# Patient Record
Sex: Male | Born: 1955 | Race: White | Hispanic: No | Marital: Married
Health system: Southern US, Community
[De-identification: ages and names within clinical notes are randomized; demographics above are authoritative.]

---

## 2006-11-02 ENCOUNTER — Ambulatory Visit: Payer: Self-pay | Admitting: Internal Medicine

## 2008-02-10 ENCOUNTER — Ambulatory Visit: Payer: Self-pay | Admitting: Specialist

## 2008-02-21 ENCOUNTER — Ambulatory Visit: Payer: Self-pay | Admitting: Orthopedic Surgery

## 2012-02-15 ENCOUNTER — Ambulatory Visit: Payer: Self-pay | Admitting: General Practice

## 2012-02-17 LAB — PATHOLOGY REPORT

## 2015-01-25 ENCOUNTER — Ambulatory Visit: Payer: Self-pay | Admitting: Internal Medicine

## 2015-02-07 ENCOUNTER — Ambulatory Visit: Payer: Self-pay | Admitting: Internal Medicine

## 2015-04-07 NOTE — Op Note (Signed)
PATIENT NAME:  Chris Garcia, Chris E MR#:  [id number] DATE OF BIRTH:  1956-02-10  DATE OF PROCEDURE:  02/15/2012  PREOPERATIVE DIAGNOSIS: Dupuytren's contracture to the right long finger.   POSTOPERATIVE DIAGNOSIS: Dupuytren's contracture to the right long finger.   PROCEDURES PERFORMED: Palmar fasciectomy and excision of Dupuytren's cord from the right long finger.   SURGEON: Illene LabradorJames P. Angie FavaHooten Jr., MD  ANESTHESIA: General.   ESTIMATED BLOOD LOSS: Minimal.   TOURNIQUET TIME: 68 minutes.   DRAINS: None.   INDICATIONS FOR SURGERY: The patient is a 59 year old right-hand dominant male who has been seen for complaints of Dupuytren's cords and contracture to the right long finger. He previously underwent surgery for Dupuytren's contracture to the left hand. The contracture was getting worse and he was having more difficulty with his activities of daily living. After discussion of the risks and benefits of surgical intervention, the patient expressed his understanding of the risks and benefits and agreed with plans for surgical intervention.   PROCEDURE IN DETAIL: Patient was brought into the Operating Room and, after adequate general anesthesia was achieved, a tourniquet was placed on the patient's upper right arm. Patient's right hand and arm were cleaned and prepped with alcohol and DuraPrep, draped in the usual sterile fashion. A "timeout" was performed as per usual protocol. The right upper extremity was exsanguinated using Esmarch, and tourniquet was inflated to 250 mmHg. Loupe magnification was used throughout the procedure. An incision was made in line with the pretendinous band and central cord to the mid palmar region and carried to the ulnar aspect of the base of the long finger. The cord was carefully resected with incision of the site proximally and then carefully dissected distally. Careful dissection was carried out along the area of the spiral cord. The incision was carried out in a GraysonBruner  type fashion to the level of the PIP joint crease. Dissection was carried out with care taken to protect the neurovascular bundle. The central cord as well as a portion of the spiral cord were resected and submitted to pathology. This allowed for full extension of the digit. The wound was irrigated with copious amounts of normal saline with antibiotic solution. Tourniquet was deflated after a total tourniquet time of 68 minutes. Good hemostasis was noted. Skin edges were reapproximated using a horizontal mattress suture of 5-0 nylon. Good approximation was noted. Good capillary refill was appreciated to the digit. 10 mL of 0.25% Marcaine was injected along the incision site. A sterile dressing was applied followed by application of a volar splint with the digits placed in position of function.   Patient tolerated procedure well. He was transported to the recovery room in stable condition.   ____________________________ Illene LabradorJames P. Angie FavaHooten Jr., MD jph:cms D: 02/15/2012 18:17:02 ET T: 02/16/2012 09:31:45 ET JOB#: [id number]  cc: Fayrene FearingJames P. Angie FavaHooten Jr., MD, <Dictator> JAMES P Angie FavaHOOTEN JR MD ELECTRONICALLY SIGNED 02/21/2012 20:38

## 2016-04-01 IMAGING — CR DG CHEST 2V
1 series · 2 of 2 positions shown · non-contrast
Comparison: None.

CLINICAL DATA: Melanoma on face

EXAM:
CHEST  2 VIEW

[Series 1: w chest pa · 0.14mm/px · 2 of 2 slices shown]
[im 1/2]
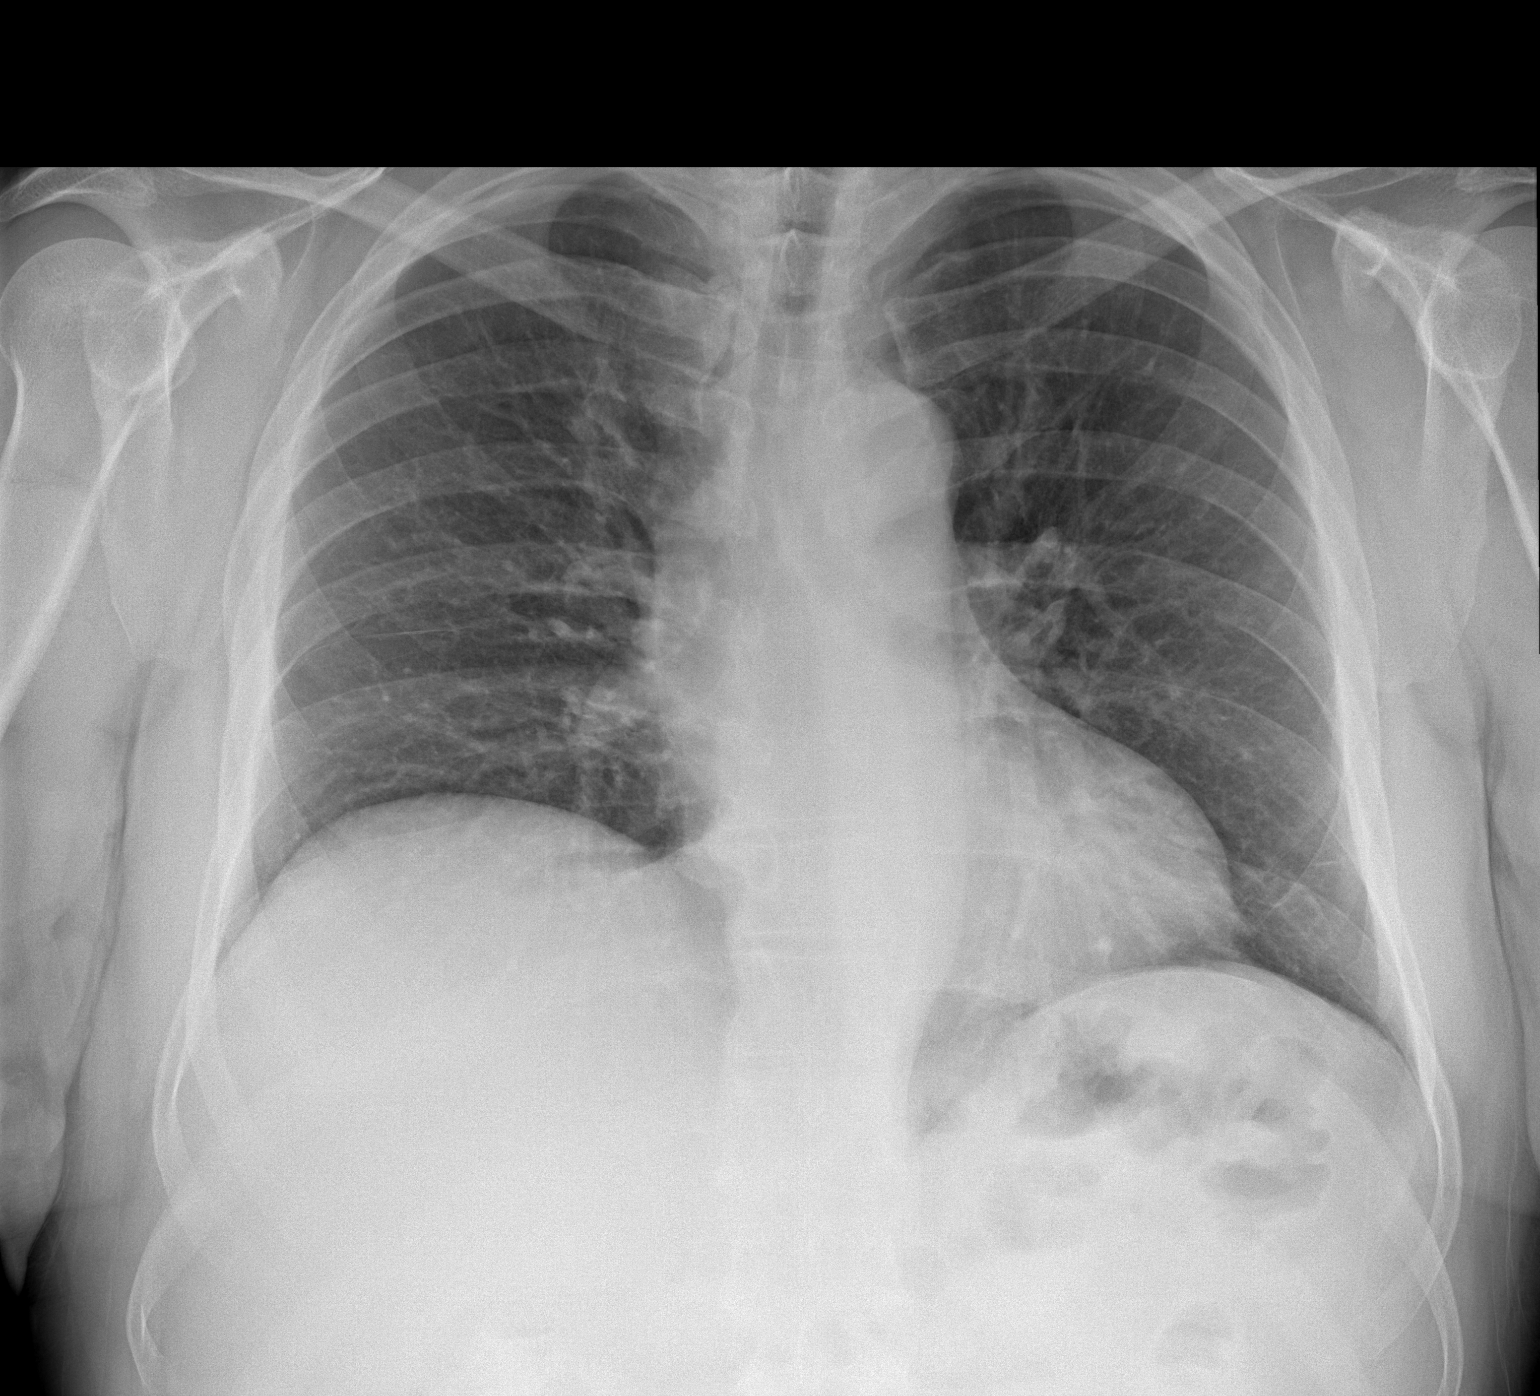
[im 2/2]
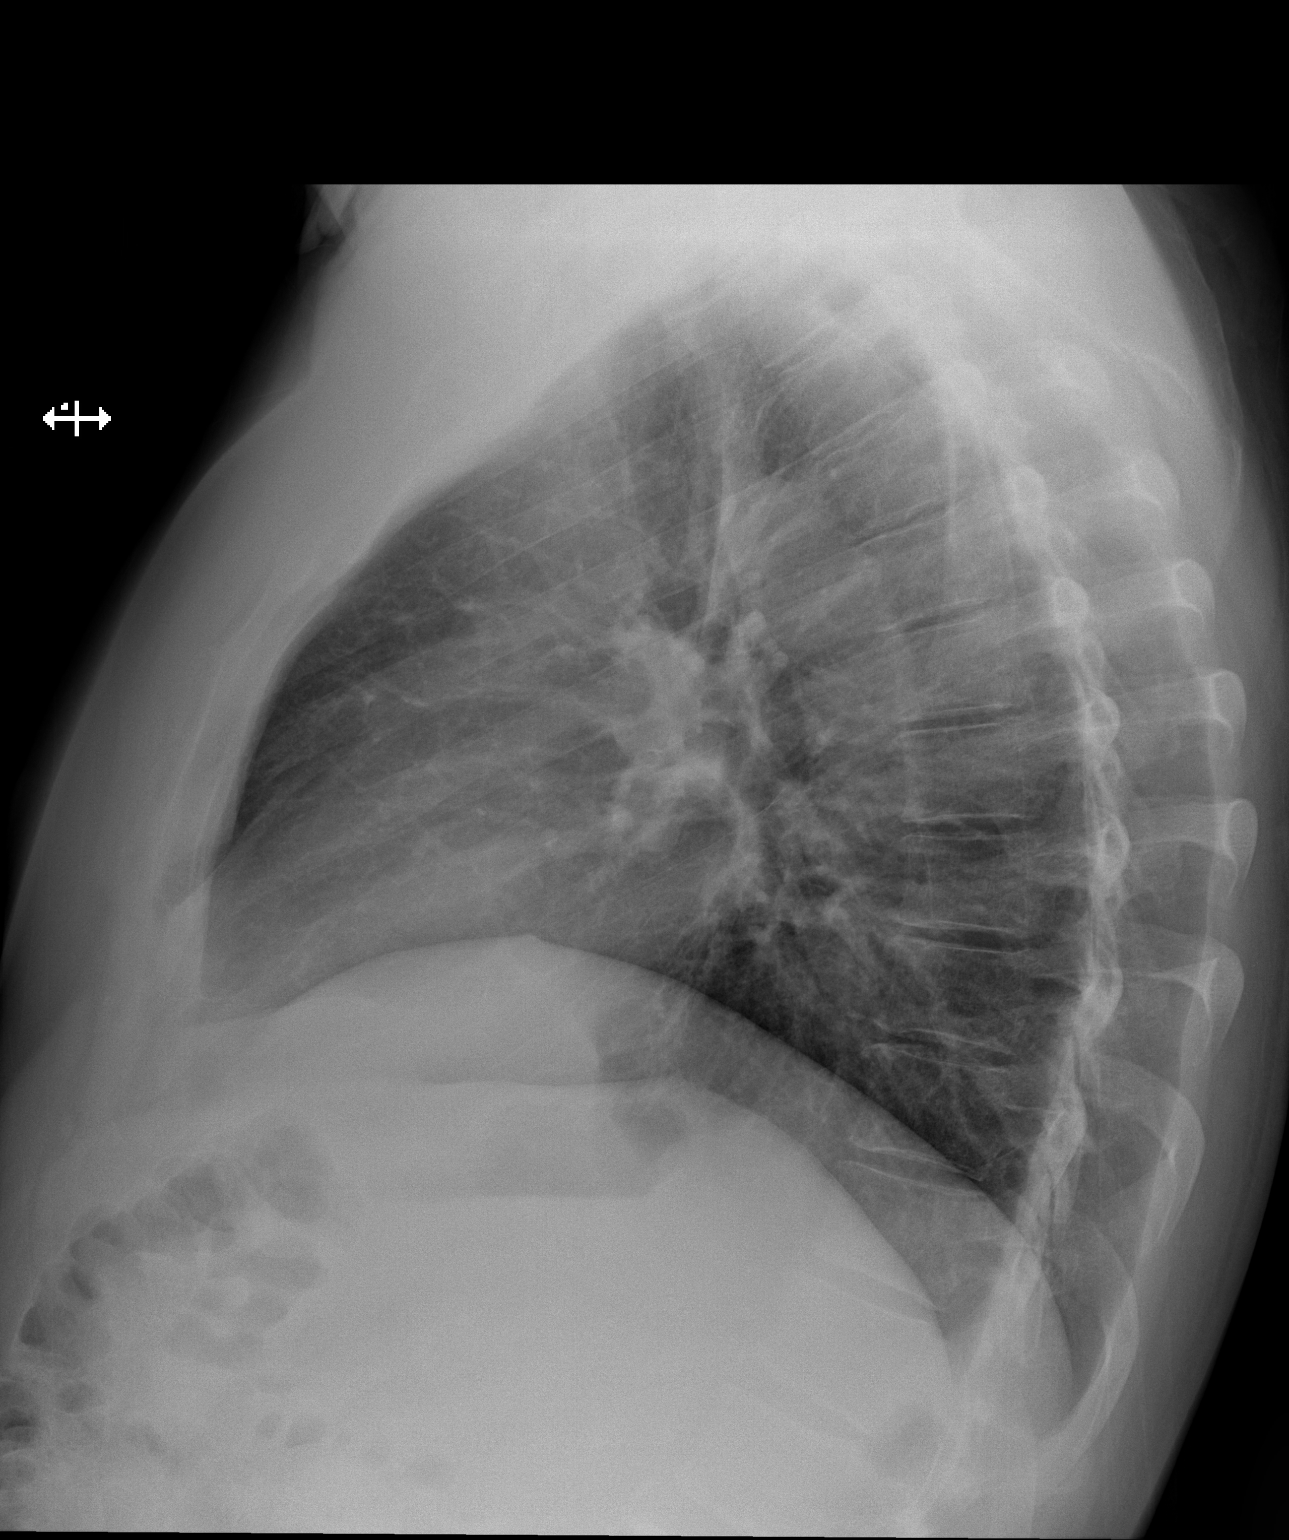

[2 of 2 positions shown; findings below may reference images not displayed]

FINDINGS: Lungs are clear. Heart size and pulmonary vascularity are normal. No
adenopathy. No pneumothorax. No blastic or lytic bone lesions.
IMPRESSION: No neoplastic focus appreciable.  No edema or consolidation.

## 2017-01-13 DIAGNOSIS — D3142 Benign neoplasm of left ciliary body: Secondary | ICD-10-CM | POA: Diagnosis not present

## 2017-01-13 DIAGNOSIS — C6931 Malignant neoplasm of right choroid: Secondary | ICD-10-CM | POA: Diagnosis not present

## 2017-01-28 DIAGNOSIS — H2513 Age-related nuclear cataract, bilateral: Secondary | ICD-10-CM | POA: Diagnosis not present

## 2017-01-28 DIAGNOSIS — H401131 Primary open-angle glaucoma, bilateral, mild stage: Secondary | ICD-10-CM | POA: Diagnosis not present

## 2017-01-28 DIAGNOSIS — H42 Glaucoma in diseases classified elsewhere: Secondary | ICD-10-CM | POA: Diagnosis not present

## 2017-04-14 DIAGNOSIS — Z0000 Encounter for general adult medical examination without abnormal findings: Secondary | ICD-10-CM | POA: Diagnosis not present

## 2017-04-15 DIAGNOSIS — R5381 Other malaise: Secondary | ICD-10-CM | POA: Diagnosis not present

## 2017-04-15 DIAGNOSIS — E784 Other hyperlipidemia: Secondary | ICD-10-CM | POA: Diagnosis not present

## 2017-04-15 DIAGNOSIS — I10 Essential (primary) hypertension: Secondary | ICD-10-CM | POA: Diagnosis not present

## 2017-04-15 DIAGNOSIS — Z125 Encounter for screening for malignant neoplasm of prostate: Secondary | ICD-10-CM | POA: Diagnosis not present

## 2017-05-05 DIAGNOSIS — E669 Obesity, unspecified: Secondary | ICD-10-CM | POA: Diagnosis not present

## 2017-05-05 DIAGNOSIS — R5383 Other fatigue: Secondary | ICD-10-CM | POA: Diagnosis not present

## 2017-05-05 DIAGNOSIS — C6991 Malignant neoplasm of unspecified site of right eye: Secondary | ICD-10-CM | POA: Diagnosis not present

## 2017-05-05 DIAGNOSIS — I119 Hypertensive heart disease without heart failure: Secondary | ICD-10-CM | POA: Diagnosis not present

## 2017-05-27 DIAGNOSIS — H2513 Age-related nuclear cataract, bilateral: Secondary | ICD-10-CM | POA: Diagnosis not present

## 2017-05-27 DIAGNOSIS — H401131 Primary open-angle glaucoma, bilateral, mild stage: Secondary | ICD-10-CM | POA: Diagnosis not present

## 2017-06-09 DIAGNOSIS — C6931 Malignant neoplasm of right choroid: Secondary | ICD-10-CM | POA: Diagnosis not present

## 2017-06-09 DIAGNOSIS — C439 Malignant melanoma of skin, unspecified: Secondary | ICD-10-CM | POA: Diagnosis not present

## 2017-09-23 DIAGNOSIS — C6931 Malignant neoplasm of right choroid: Secondary | ICD-10-CM | POA: Diagnosis not present

## 2017-09-23 DIAGNOSIS — H401131 Primary open-angle glaucoma, bilateral, mild stage: Secondary | ICD-10-CM | POA: Diagnosis not present

## 2017-11-10 DIAGNOSIS — C6931 Malignant neoplasm of right choroid: Secondary | ICD-10-CM | POA: Diagnosis not present

## 2017-11-10 DIAGNOSIS — D487 Neoplasm of uncertain behavior of other specified sites: Secondary | ICD-10-CM | POA: Diagnosis not present

## 2017-12-01 DIAGNOSIS — D1803 Hemangioma of intra-abdominal structures: Secondary | ICD-10-CM | POA: Diagnosis not present

## 2017-12-01 DIAGNOSIS — C6931 Malignant neoplasm of right choroid: Secondary | ICD-10-CM | POA: Diagnosis not present

## 2018-01-19 DIAGNOSIS — D3142 Benign neoplasm of left ciliary body: Secondary | ICD-10-CM | POA: Diagnosis not present

## 2018-01-19 DIAGNOSIS — C6931 Malignant neoplasm of right choroid: Secondary | ICD-10-CM | POA: Diagnosis not present

## 2018-02-10 DIAGNOSIS — H401131 Primary open-angle glaucoma, bilateral, mild stage: Secondary | ICD-10-CM | POA: Diagnosis not present

## 2018-02-16 DIAGNOSIS — C6991 Malignant neoplasm of unspecified site of right eye: Secondary | ICD-10-CM | POA: Diagnosis not present

## 2018-02-16 DIAGNOSIS — E781 Pure hyperglyceridemia: Secondary | ICD-10-CM | POA: Diagnosis not present

## 2018-02-16 DIAGNOSIS — I119 Hypertensive heart disease without heart failure: Secondary | ICD-10-CM | POA: Diagnosis not present

## 2018-02-16 DIAGNOSIS — E8881 Metabolic syndrome: Secondary | ICD-10-CM | POA: Diagnosis not present

## 2018-04-13 DIAGNOSIS — I10 Essential (primary) hypertension: Secondary | ICD-10-CM | POA: Diagnosis not present

## 2018-04-13 DIAGNOSIS — E7849 Other hyperlipidemia: Secondary | ICD-10-CM | POA: Diagnosis not present

## 2018-04-13 DIAGNOSIS — Z125 Encounter for screening for malignant neoplasm of prostate: Secondary | ICD-10-CM | POA: Diagnosis not present

## 2018-04-13 DIAGNOSIS — R5381 Other malaise: Secondary | ICD-10-CM | POA: Diagnosis not present

## 2018-04-20 DIAGNOSIS — Z0000 Encounter for general adult medical examination without abnormal findings: Secondary | ICD-10-CM | POA: Diagnosis not present

## 2018-06-09 DIAGNOSIS — H2513 Age-related nuclear cataract, bilateral: Secondary | ICD-10-CM | POA: Diagnosis not present

## 2018-06-09 DIAGNOSIS — H401131 Primary open-angle glaucoma, bilateral, mild stage: Secondary | ICD-10-CM | POA: Diagnosis not present

## 2018-06-22 DIAGNOSIS — C439 Malignant melanoma of skin, unspecified: Secondary | ICD-10-CM | POA: Diagnosis not present

## 2018-06-22 DIAGNOSIS — D1803 Hemangioma of intra-abdominal structures: Secondary | ICD-10-CM | POA: Diagnosis not present

## 2018-06-22 DIAGNOSIS — C6931 Malignant neoplasm of right choroid: Secondary | ICD-10-CM | POA: Diagnosis not present

## 2018-07-20 DIAGNOSIS — C6931 Malignant neoplasm of right choroid: Secondary | ICD-10-CM | POA: Diagnosis not present

## 2018-08-05 DIAGNOSIS — K047 Periapical abscess without sinus: Secondary | ICD-10-CM | POA: Diagnosis not present

## 2018-08-05 DIAGNOSIS — E781 Pure hyperglyceridemia: Secondary | ICD-10-CM | POA: Diagnosis not present

## 2018-08-05 DIAGNOSIS — I119 Hypertensive heart disease without heart failure: Secondary | ICD-10-CM | POA: Diagnosis not present

## 2018-08-05 DIAGNOSIS — C6991 Malignant neoplasm of unspecified site of right eye: Secondary | ICD-10-CM | POA: Diagnosis not present

## 2018-10-17 DIAGNOSIS — H42 Glaucoma in diseases classified elsewhere: Secondary | ICD-10-CM | POA: Diagnosis not present

## 2018-10-17 DIAGNOSIS — H401131 Primary open-angle glaucoma, bilateral, mild stage: Secondary | ICD-10-CM | POA: Diagnosis not present

## 2018-12-21 DIAGNOSIS — C6931 Malignant neoplasm of right choroid: Secondary | ICD-10-CM | POA: Diagnosis not present

## 2018-12-21 DIAGNOSIS — C439 Malignant melanoma of skin, unspecified: Secondary | ICD-10-CM | POA: Diagnosis not present

## 2019-01-25 DIAGNOSIS — H5319 Other subjective visual disturbances: Secondary | ICD-10-CM | POA: Diagnosis not present

## 2019-01-25 DIAGNOSIS — H35361 Drusen (degenerative) of macula, right eye: Secondary | ICD-10-CM | POA: Diagnosis not present

## 2019-01-25 DIAGNOSIS — C6931 Malignant neoplasm of right choroid: Secondary | ICD-10-CM | POA: Diagnosis not present

## 2019-02-09 DIAGNOSIS — I119 Hypertensive heart disease without heart failure: Secondary | ICD-10-CM | POA: Diagnosis not present

## 2019-02-09 DIAGNOSIS — J219 Acute bronchiolitis, unspecified: Secondary | ICD-10-CM | POA: Diagnosis not present

## 2019-02-09 DIAGNOSIS — C6991 Malignant neoplasm of unspecified site of right eye: Secondary | ICD-10-CM | POA: Diagnosis not present

## 2019-02-09 DIAGNOSIS — E781 Pure hyperglyceridemia: Secondary | ICD-10-CM | POA: Diagnosis not present

## 2019-02-20 DIAGNOSIS — H401131 Primary open-angle glaucoma, bilateral, mild stage: Secondary | ICD-10-CM | POA: Diagnosis not present

## 2019-02-20 DIAGNOSIS — H42 Glaucoma in diseases classified elsewhere: Secondary | ICD-10-CM | POA: Diagnosis not present

## 2019-02-20 DIAGNOSIS — H2513 Age-related nuclear cataract, bilateral: Secondary | ICD-10-CM | POA: Diagnosis not present

## 2019-06-07 DIAGNOSIS — R161 Splenomegaly, not elsewhere classified: Secondary | ICD-10-CM | POA: Diagnosis not present

## 2019-06-07 DIAGNOSIS — C6931 Malignant neoplasm of right choroid: Secondary | ICD-10-CM | POA: Diagnosis not present

## 2019-06-07 DIAGNOSIS — C439 Malignant melanoma of skin, unspecified: Secondary | ICD-10-CM | POA: Diagnosis not present

## 2019-06-07 DIAGNOSIS — I2699 Other pulmonary embolism without acute cor pulmonale: Secondary | ICD-10-CM | POA: Diagnosis not present

## 2019-06-12 DIAGNOSIS — I2699 Other pulmonary embolism without acute cor pulmonale: Secondary | ICD-10-CM | POA: Diagnosis not present

## 2019-06-29 DIAGNOSIS — H2513 Age-related nuclear cataract, bilateral: Secondary | ICD-10-CM | POA: Diagnosis not present

## 2019-06-29 DIAGNOSIS — H42 Glaucoma in diseases classified elsewhere: Secondary | ICD-10-CM | POA: Diagnosis not present

## 2019-06-29 DIAGNOSIS — H401131 Primary open-angle glaucoma, bilateral, mild stage: Secondary | ICD-10-CM | POA: Diagnosis not present

## 2019-07-21 DIAGNOSIS — Z7689 Persons encountering health services in other specified circumstances: Secondary | ICD-10-CM | POA: Diagnosis not present

## 2019-07-21 DIAGNOSIS — K449 Diaphragmatic hernia without obstruction or gangrene: Secondary | ICD-10-CM | POA: Diagnosis not present

## 2019-07-21 DIAGNOSIS — E782 Mixed hyperlipidemia: Secondary | ICD-10-CM | POA: Diagnosis not present

## 2019-07-21 DIAGNOSIS — K219 Gastro-esophageal reflux disease without esophagitis: Secondary | ICD-10-CM | POA: Diagnosis not present

## 2019-07-21 DIAGNOSIS — I10 Essential (primary) hypertension: Secondary | ICD-10-CM | POA: Diagnosis not present

## 2019-08-03 DIAGNOSIS — D100 Benign neoplasm of lip: Secondary | ICD-10-CM | POA: Diagnosis not present

## 2019-08-03 DIAGNOSIS — D582 Other hemoglobinopathies: Secondary | ICD-10-CM | POA: Diagnosis not present

## 2019-08-03 DIAGNOSIS — R748 Abnormal levels of other serum enzymes: Secondary | ICD-10-CM | POA: Diagnosis not present

## 2019-08-03 DIAGNOSIS — K130 Diseases of lips: Secondary | ICD-10-CM | POA: Diagnosis not present

## 2019-09-21 DIAGNOSIS — E782 Mixed hyperlipidemia: Secondary | ICD-10-CM | POA: Diagnosis not present

## 2019-09-21 DIAGNOSIS — Z125 Encounter for screening for malignant neoplasm of prostate: Secondary | ICD-10-CM | POA: Diagnosis not present

## 2019-09-21 DIAGNOSIS — Z0000 Encounter for general adult medical examination without abnormal findings: Secondary | ICD-10-CM | POA: Diagnosis not present

## 2019-09-21 DIAGNOSIS — Z1159 Encounter for screening for other viral diseases: Secondary | ICD-10-CM | POA: Diagnosis not present

## 2019-09-21 DIAGNOSIS — R748 Abnormal levels of other serum enzymes: Secondary | ICD-10-CM | POA: Diagnosis not present

## 2019-10-04 DIAGNOSIS — C6931 Malignant neoplasm of right choroid: Secondary | ICD-10-CM | POA: Diagnosis not present

## 2019-10-18 DIAGNOSIS — M79641 Pain in right hand: Secondary | ICD-10-CM | POA: Diagnosis not present

## 2019-10-18 DIAGNOSIS — M79642 Pain in left hand: Secondary | ICD-10-CM | POA: Diagnosis not present

## 2019-10-18 DIAGNOSIS — M720 Palmar fascial fibromatosis [Dupuytren]: Secondary | ICD-10-CM | POA: Diagnosis not present

## 2019-11-30 DIAGNOSIS — H42 Glaucoma in diseases classified elsewhere: Secondary | ICD-10-CM | POA: Diagnosis not present

## 2019-11-30 DIAGNOSIS — H401131 Primary open-angle glaucoma, bilateral, mild stage: Secondary | ICD-10-CM | POA: Diagnosis not present

## 2019-12-12 DIAGNOSIS — H938X1 Other specified disorders of right ear: Secondary | ICD-10-CM | POA: Diagnosis not present

## 2019-12-12 DIAGNOSIS — J0110 Acute frontal sinusitis, unspecified: Secondary | ICD-10-CM | POA: Diagnosis not present

## 2019-12-18 DIAGNOSIS — Z20828 Contact with and (suspected) exposure to other viral communicable diseases: Secondary | ICD-10-CM | POA: Diagnosis not present

## 2019-12-18 DIAGNOSIS — Z1159 Encounter for screening for other viral diseases: Secondary | ICD-10-CM | POA: Diagnosis not present

## 2019-12-21 DIAGNOSIS — M79641 Pain in right hand: Secondary | ICD-10-CM | POA: Diagnosis not present

## 2019-12-21 DIAGNOSIS — G8918 Other acute postprocedural pain: Secondary | ICD-10-CM | POA: Diagnosis not present

## 2019-12-21 DIAGNOSIS — M720 Palmar fascial fibromatosis [Dupuytren]: Secondary | ICD-10-CM | POA: Diagnosis not present

## 2019-12-25 DIAGNOSIS — M720 Palmar fascial fibromatosis [Dupuytren]: Secondary | ICD-10-CM | POA: Diagnosis not present

## 2019-12-25 DIAGNOSIS — Z4789 Encounter for other orthopedic aftercare: Secondary | ICD-10-CM | POA: Diagnosis not present

## 2019-12-25 DIAGNOSIS — M25641 Stiffness of right hand, not elsewhere classified: Secondary | ICD-10-CM | POA: Diagnosis not present

## 2019-12-25 DIAGNOSIS — M79641 Pain in right hand: Secondary | ICD-10-CM | POA: Diagnosis not present

## 2020-01-02 DIAGNOSIS — M25641 Stiffness of right hand, not elsewhere classified: Secondary | ICD-10-CM | POA: Diagnosis not present

## 2020-01-02 DIAGNOSIS — M79641 Pain in right hand: Secondary | ICD-10-CM | POA: Diagnosis not present

## 2020-01-02 DIAGNOSIS — M720 Palmar fascial fibromatosis [Dupuytren]: Secondary | ICD-10-CM | POA: Diagnosis not present

## 2020-01-02 DIAGNOSIS — Z4789 Encounter for other orthopedic aftercare: Secondary | ICD-10-CM | POA: Diagnosis not present

## 2020-01-10 DIAGNOSIS — M25641 Stiffness of right hand, not elsewhere classified: Secondary | ICD-10-CM | POA: Diagnosis not present

## 2020-01-10 DIAGNOSIS — Z4789 Encounter for other orthopedic aftercare: Secondary | ICD-10-CM | POA: Diagnosis not present

## 2020-01-10 DIAGNOSIS — M720 Palmar fascial fibromatosis [Dupuytren]: Secondary | ICD-10-CM | POA: Diagnosis not present

## 2020-01-10 DIAGNOSIS — M79641 Pain in right hand: Secondary | ICD-10-CM | POA: Diagnosis not present

## 2020-01-17 DIAGNOSIS — M720 Palmar fascial fibromatosis [Dupuytren]: Secondary | ICD-10-CM | POA: Diagnosis not present

## 2020-01-17 DIAGNOSIS — M25641 Stiffness of right hand, not elsewhere classified: Secondary | ICD-10-CM | POA: Diagnosis not present

## 2020-01-17 DIAGNOSIS — Z4789 Encounter for other orthopedic aftercare: Secondary | ICD-10-CM | POA: Diagnosis not present

## 2020-01-17 DIAGNOSIS — M79641 Pain in right hand: Secondary | ICD-10-CM | POA: Diagnosis not present

## 2020-01-24 DIAGNOSIS — M720 Palmar fascial fibromatosis [Dupuytren]: Secondary | ICD-10-CM | POA: Diagnosis not present

## 2020-01-24 DIAGNOSIS — M79641 Pain in right hand: Secondary | ICD-10-CM | POA: Diagnosis not present

## 2020-01-24 DIAGNOSIS — M25641 Stiffness of right hand, not elsewhere classified: Secondary | ICD-10-CM | POA: Diagnosis not present

## 2020-01-24 DIAGNOSIS — Z4789 Encounter for other orthopedic aftercare: Secondary | ICD-10-CM | POA: Diagnosis not present

## 2020-01-31 DIAGNOSIS — C6930 Malignant neoplasm of unspecified choroid: Secondary | ICD-10-CM | POA: Diagnosis not present

## 2020-01-31 DIAGNOSIS — I2510 Atherosclerotic heart disease of native coronary artery without angina pectoris: Secondary | ICD-10-CM | POA: Diagnosis not present

## 2020-01-31 DIAGNOSIS — M720 Palmar fascial fibromatosis [Dupuytren]: Secondary | ICD-10-CM | POA: Diagnosis not present

## 2020-01-31 DIAGNOSIS — I2699 Other pulmonary embolism without acute cor pulmonale: Secondary | ICD-10-CM | POA: Diagnosis not present

## 2020-01-31 DIAGNOSIS — Z4889 Encounter for other specified surgical aftercare: Secondary | ICD-10-CM | POA: Diagnosis not present

## 2020-01-31 DIAGNOSIS — C6931 Malignant neoplasm of right choroid: Secondary | ICD-10-CM | POA: Diagnosis not present

## 2020-02-07 DIAGNOSIS — H5319 Other subjective visual disturbances: Secondary | ICD-10-CM | POA: Diagnosis not present

## 2020-02-07 DIAGNOSIS — C6931 Malignant neoplasm of right choroid: Secondary | ICD-10-CM | POA: Diagnosis not present

## 2020-02-14 DIAGNOSIS — M25641 Stiffness of right hand, not elsewhere classified: Secondary | ICD-10-CM | POA: Diagnosis not present

## 2020-02-14 DIAGNOSIS — Z4789 Encounter for other orthopedic aftercare: Secondary | ICD-10-CM | POA: Diagnosis not present

## 2020-02-14 DIAGNOSIS — M79641 Pain in right hand: Secondary | ICD-10-CM | POA: Diagnosis not present

## 2020-02-14 DIAGNOSIS — M720 Palmar fascial fibromatosis [Dupuytren]: Secondary | ICD-10-CM | POA: Diagnosis not present

## 2020-02-21 DIAGNOSIS — M25641 Stiffness of right hand, not elsewhere classified: Secondary | ICD-10-CM | POA: Diagnosis not present

## 2020-02-21 DIAGNOSIS — Z4789 Encounter for other orthopedic aftercare: Secondary | ICD-10-CM | POA: Diagnosis not present

## 2020-02-21 DIAGNOSIS — M79641 Pain in right hand: Secondary | ICD-10-CM | POA: Diagnosis not present

## 2020-02-21 DIAGNOSIS — M720 Palmar fascial fibromatosis [Dupuytren]: Secondary | ICD-10-CM | POA: Diagnosis not present

## 2020-03-06 DIAGNOSIS — Z4789 Encounter for other orthopedic aftercare: Secondary | ICD-10-CM | POA: Diagnosis not present

## 2020-03-06 DIAGNOSIS — M79641 Pain in right hand: Secondary | ICD-10-CM | POA: Diagnosis not present

## 2020-03-06 DIAGNOSIS — M720 Palmar fascial fibromatosis [Dupuytren]: Secondary | ICD-10-CM | POA: Diagnosis not present

## 2020-03-06 DIAGNOSIS — M25641 Stiffness of right hand, not elsewhere classified: Secondary | ICD-10-CM | POA: Diagnosis not present

## 2020-03-20 DIAGNOSIS — M79641 Pain in right hand: Secondary | ICD-10-CM | POA: Diagnosis not present

## 2020-03-20 DIAGNOSIS — Z4789 Encounter for other orthopedic aftercare: Secondary | ICD-10-CM | POA: Diagnosis not present

## 2020-03-20 DIAGNOSIS — M25641 Stiffness of right hand, not elsewhere classified: Secondary | ICD-10-CM | POA: Diagnosis not present

## 2020-03-20 DIAGNOSIS — M720 Palmar fascial fibromatosis [Dupuytren]: Secondary | ICD-10-CM | POA: Diagnosis not present

## 2020-03-21 DIAGNOSIS — M545 Low back pain: Secondary | ICD-10-CM | POA: Diagnosis not present

## 2020-03-21 DIAGNOSIS — M47816 Spondylosis without myelopathy or radiculopathy, lumbar region: Secondary | ICD-10-CM | POA: Diagnosis not present

## 2020-03-21 DIAGNOSIS — D582 Other hemoglobinopathies: Secondary | ICD-10-CM | POA: Diagnosis not present

## 2020-04-17 DIAGNOSIS — I10 Essential (primary) hypertension: Secondary | ICD-10-CM | POA: Diagnosis not present

## 2020-04-17 DIAGNOSIS — E782 Mixed hyperlipidemia: Secondary | ICD-10-CM | POA: Diagnosis not present

## 2020-04-17 DIAGNOSIS — D582 Other hemoglobinopathies: Secondary | ICD-10-CM | POA: Diagnosis not present

## 2020-04-17 DIAGNOSIS — R7989 Other specified abnormal findings of blood chemistry: Secondary | ICD-10-CM | POA: Diagnosis not present

## 2020-05-02 DIAGNOSIS — H401131 Primary open-angle glaucoma, bilateral, mild stage: Secondary | ICD-10-CM | POA: Diagnosis not present

## 2020-05-02 DIAGNOSIS — H2513 Age-related nuclear cataract, bilateral: Secondary | ICD-10-CM | POA: Diagnosis not present

## 2020-06-21 ENCOUNTER — Other Ambulatory Visit: Payer: Self-pay | Admitting: Internal Medicine
